# Patient Record
Sex: Male | Born: 1960 | Smoking: Current every day smoker
Health system: Southern US, Community
[De-identification: ages and names within clinical notes are randomized; demographics above are authoritative.]

## PROBLEM LIST (undated history)

## (undated) DIAGNOSIS — M179 Osteoarthritis of knee, unspecified: Secondary | ICD-10-CM

## (undated) DIAGNOSIS — F419 Anxiety disorder, unspecified: Secondary | ICD-10-CM

## (undated) DIAGNOSIS — B1920 Unspecified viral hepatitis C without hepatic coma: Secondary | ICD-10-CM

## (undated) DIAGNOSIS — I2510 Atherosclerotic heart disease of native coronary artery without angina pectoris: Secondary | ICD-10-CM

## (undated) DIAGNOSIS — R0689 Other abnormalities of breathing: Secondary | ICD-10-CM

## (undated) DIAGNOSIS — N189 Chronic kidney disease, unspecified: Secondary | ICD-10-CM

## (undated) DIAGNOSIS — F1210 Cannabis abuse, uncomplicated: Secondary | ICD-10-CM

## (undated) DIAGNOSIS — E669 Obesity, unspecified: Secondary | ICD-10-CM

## (undated) DIAGNOSIS — E785 Hyperlipidemia, unspecified: Secondary | ICD-10-CM

## (undated) DIAGNOSIS — R0600 Dyspnea, unspecified: Secondary | ICD-10-CM

## (undated) DIAGNOSIS — M109 Gout, unspecified: Secondary | ICD-10-CM

## (undated) DIAGNOSIS — K219 Gastro-esophageal reflux disease without esophagitis: Secondary | ICD-10-CM

## (undated) DIAGNOSIS — M169 Osteoarthritis of hip, unspecified: Secondary | ICD-10-CM

## (undated) DIAGNOSIS — M1710 Unilateral primary osteoarthritis, unspecified knee: Secondary | ICD-10-CM

## (undated) DIAGNOSIS — F1410 Cocaine abuse, uncomplicated: Secondary | ICD-10-CM

## (undated) DIAGNOSIS — I10 Essential (primary) hypertension: Secondary | ICD-10-CM

## (undated) HISTORY — DX: Gastro-esophageal reflux disease without esophagitis: K21.9

## (undated) HISTORY — DX: Dyspnea, unspecified: R06.89

## (undated) HISTORY — DX: Gout, unspecified: M10.9

## (undated) HISTORY — DX: Unspecified viral hepatitis C without hepatic coma: B19.20

## (undated) HISTORY — DX: Chronic kidney disease, unspecified: N18.9

## (undated) HISTORY — DX: Cannabis abuse, uncomplicated: F12.10

## (undated) HISTORY — DX: Anxiety disorder, unspecified: F41.9

## (undated) HISTORY — DX: Osteoarthritis of hip, unspecified: M16.9

## (undated) HISTORY — DX: Unilateral primary osteoarthritis, unspecified knee: M17.10

## (undated) HISTORY — DX: Cocaine abuse, uncomplicated: F14.10

## (undated) HISTORY — DX: Osteoarthritis of knee, unspecified: M17.9

## (undated) HISTORY — DX: Obesity, unspecified: E66.9

## (undated) HISTORY — PX: KNEE SURGERY: SHX244

## (undated) HISTORY — DX: Dyspnea, unspecified: R06.00

## (undated) HISTORY — DX: Hyperlipidemia, unspecified: E78.5

## (undated) HISTORY — DX: Essential (primary) hypertension: I10

## (undated) HISTORY — DX: Atherosclerotic heart disease of native coronary artery without angina pectoris: I25.10

---

## 2008-03-01 HISTORY — PX: CARDIAC CATHETERIZATION: SHX172

## 2008-03-01 HISTORY — PX: CORONARY ANGIOPLASTY: SHX604

## 2011-11-26 ENCOUNTER — Emergency Department: Payer: Self-pay | Admitting: Emergency Medicine

## 2011-12-07 ENCOUNTER — Inpatient Hospital Stay: Payer: Self-pay | Admitting: Internal Medicine

## 2011-12-07 LAB — DRUG SCREEN, URINE
Cannabinoid 50 Ng, Ur ~~LOC~~: NEGATIVE (ref ?–50)
Cocaine Metabolite,Ur ~~LOC~~: POSITIVE (ref ?–300)
MDMA (Ecstasy)Ur Screen: NEGATIVE (ref ?–500)
Opiate, Ur Screen: POSITIVE (ref ?–300)
Phencyclidine (PCP) Ur S: NEGATIVE (ref ?–25)

## 2011-12-07 LAB — SALICYLATE LEVEL: Salicylates, Serum: 2.8 mg/dL

## 2011-12-07 LAB — TROPONIN I
Troponin-I: 0.38 ng/mL — ABNORMAL HIGH
Troponin-I: 0.39 ng/mL — ABNORMAL HIGH

## 2011-12-07 LAB — COMPREHENSIVE METABOLIC PANEL
Albumin: 3.9 g/dL (ref 3.4–5.0)
Alkaline Phosphatase: 101 U/L (ref 50–136)
Calcium, Total: 8.5 mg/dL (ref 8.5–10.1)
Co2: 26 mmol/L (ref 21–32)
EGFR (Non-African Amer.): 45 — ABNORMAL LOW
Glucose: 134 mg/dL — ABNORMAL HIGH (ref 65–99)
SGOT(AST): 27 U/L (ref 15–37)
SGPT (ALT): 29 U/L (ref 12–78)
Sodium: 139 mmol/L (ref 136–145)

## 2011-12-07 LAB — CBC
MCV: 92 fL (ref 80–100)
Platelet: 175 10*3/uL (ref 150–440)
RBC: 4.56 10*6/uL (ref 4.40–5.90)
RDW: 15.1 % — ABNORMAL HIGH (ref 11.5–14.5)
WBC: 9.3 10*3/uL (ref 3.8–10.6)

## 2011-12-07 LAB — ACETAMINOPHEN LEVEL: Acetaminophen: <2 ug/mL

## 2011-12-07 LAB — APTT: Activated PTT: 30.5 secs (ref 23.6–35.9)

## 2011-12-07 LAB — CK TOTAL AND CKMB (NOT AT ARMC)
CK, Total: 175 U/L (ref 35–232)
CK-MB: 3.2 ng/mL (ref 0.5–3.6)
CK-MB: 2.7 ng/mL (ref 0.5–3.6)

## 2011-12-08 ENCOUNTER — Encounter: Payer: Self-pay | Admitting: *Deleted

## 2011-12-08 DIAGNOSIS — R748 Abnormal levels of other serum enzymes: Secondary | ICD-10-CM

## 2011-12-08 DIAGNOSIS — R079 Chest pain, unspecified: Secondary | ICD-10-CM

## 2011-12-08 LAB — BASIC METABOLIC PANEL
Anion Gap: 11 (ref 7–16)
BUN: 23 mg/dL — ABNORMAL HIGH (ref 7–18)
Chloride: 105 mmol/L (ref 98–107)
Co2: 25 mmol/L (ref 21–32)
Creatinine: 1.57 mg/dL — ABNORMAL HIGH (ref 0.60–1.30)
EGFR (African American): 59 — ABNORMAL LOW
EGFR (Non-African Amer.): 51 — ABNORMAL LOW
Glucose: 115 mg/dL — ABNORMAL HIGH (ref 65–99)
Sodium: 141 mmol/L (ref 136–145)

## 2011-12-08 LAB — TROPONIN I: Troponin-I: 0.33 ng/mL — ABNORMAL HIGH

## 2011-12-08 LAB — CBC WITH DIFFERENTIAL/PLATELET
Basophil #: 0.1 10*3/uL (ref 0.0–0.1)
Eosinophil #: 0.3 10*3/uL (ref 0.0–0.7)
HGB: 13.5 g/dL (ref 13.0–18.0)
Lymphocyte #: 2 10*3/uL (ref 1.0–3.6)
MCHC: 35.4 g/dL (ref 32.0–36.0)
Monocyte #: 0.7 x10 3/mm (ref 0.2–1.0)
Neutrophil #: 4 10*3/uL (ref 1.4–6.5)
Neutrophil %: 56.3 %
RBC: 4.18 10*6/uL — ABNORMAL LOW (ref 4.40–5.90)

## 2011-12-08 LAB — APTT: Activated PTT: 36.1 secs — ABNORMAL HIGH (ref 23.6–35.9)

## 2011-12-08 LAB — LIPID PANEL
Cholesterol: 192 mg/dL (ref 0–200)
HDL Cholesterol: 22 mg/dL — ABNORMAL LOW (ref 40–60)
Ldl Cholesterol, Calc: 96 mg/dL (ref 0–100)
Triglycerides: 371 mg/dL — ABNORMAL HIGH (ref 0–200)
VLDL Cholesterol, Calc: 74 mg/dL — ABNORMAL HIGH (ref 5–40)

## 2011-12-08 LAB — CK TOTAL AND CKMB (NOT AT ARMC): CK-MB: 1.8 ng/mL (ref 0.5–3.6)

## 2011-12-08 LAB — HEMOGLOBIN A1C: Hemoglobin A1C: 5.6 % (ref 4.2–6.3)

## 2011-12-13 ENCOUNTER — Other Ambulatory Visit: Payer: Self-pay

## 2011-12-13 ENCOUNTER — Ambulatory Visit: Payer: Self-pay | Admitting: Cardiovascular Disease

## 2011-12-13 DIAGNOSIS — R079 Chest pain, unspecified: Secondary | ICD-10-CM

## 2011-12-14 ENCOUNTER — Encounter: Payer: Self-pay | Admitting: Cardiovascular Disease

## 2011-12-15 ENCOUNTER — Observation Stay: Payer: Self-pay | Admitting: Student

## 2011-12-15 LAB — COMPREHENSIVE METABOLIC PANEL
Alkaline Phosphatase: 95 U/L (ref 50–136)
Bilirubin,Total: 0.6 mg/dL (ref 0.2–1.0)
Calcium, Total: 8.5 mg/dL (ref 8.5–10.1)
Chloride: 100 mmol/L (ref 98–107)
Co2: 29 mmol/L (ref 21–32)
Creatinine: 2.38 mg/dL — ABNORMAL HIGH (ref 0.60–1.30)
EGFR (African American): 35 — ABNORMAL LOW
EGFR (Non-African Amer.): 31 — ABNORMAL LOW
Osmolality: 284 (ref 275–301)
SGPT (ALT): 49 U/L (ref 12–78)
Sodium: 139 mmol/L (ref 136–145)

## 2011-12-15 LAB — DRUG SCREEN, URINE
Barbiturates, Ur Screen: NEGATIVE (ref ?–200)
Benzodiazepine, Ur Scrn: NEGATIVE (ref ?–200)
Cannabinoid 50 Ng, Ur ~~LOC~~: NEGATIVE (ref ?–50)
Cocaine Metabolite,Ur ~~LOC~~: NEGATIVE (ref ?–300)
Phencyclidine (PCP) Ur S: NEGATIVE (ref ?–25)
Tricyclic, Ur Screen: NEGATIVE (ref ?–1000)

## 2011-12-15 LAB — CBC
HCT: 40.8 % (ref 40.0–52.0)
HGB: 14.6 g/dL (ref 13.0–18.0)
MCH: 33 pg (ref 26.0–34.0)
MCV: 92 fL (ref 80–100)
RDW: 15.2 % — ABNORMAL HIGH (ref 11.5–14.5)
WBC: 8.8 10*3/uL (ref 3.8–10.6)

## 2011-12-15 LAB — TSH: Thyroid Stimulating Horm: 1.16 u[IU]/mL

## 2011-12-16 LAB — BASIC METABOLIC PANEL
BUN: 30 mg/dL — ABNORMAL HIGH (ref 7–18)
Chloride: 102 mmol/L (ref 98–107)
Co2: 29 mmol/L (ref 21–32)
Creatinine: 2.15 mg/dL — ABNORMAL HIGH (ref 0.60–1.30)
EGFR (African American): 40 — ABNORMAL LOW
EGFR (Non-African Amer.): 35 — ABNORMAL LOW
Sodium: 140 mmol/L (ref 136–145)

## 2011-12-16 LAB — TROPONIN I: Troponin-I: 0.24 ng/mL — ABNORMAL HIGH

## 2011-12-16 LAB — CBC WITH DIFFERENTIAL/PLATELET
Basophil #: 0.1 10*3/uL (ref 0.0–0.1)
Eosinophil #: 0.3 10*3/uL (ref 0.0–0.7)
Eosinophil %: 4.8 %
HCT: 38.3 % — ABNORMAL LOW (ref 40.0–52.0)
Lymphocyte #: 1.8 10*3/uL (ref 1.0–3.6)
MCH: 31.8 pg (ref 26.0–34.0)
MCHC: 34.4 g/dL (ref 32.0–36.0)
MCV: 92 fL (ref 80–100)
Monocyte #: 0.6 x10 3/mm (ref 0.2–1.0)
Monocyte %: 9 %
Neutrophil %: 58.6 %
Platelet: 161 10*3/uL (ref 150–440)
RBC: 4.15 10*6/uL — ABNORMAL LOW (ref 4.40–5.90)
RDW: 15 % — ABNORMAL HIGH (ref 11.5–14.5)

## 2011-12-16 LAB — CK TOTAL AND CKMB (NOT AT ARMC)
CK, Total: 148 U/L (ref 35–232)
CK-MB: 1.1 ng/mL (ref 0.5–3.6)
CK-MB: 0.6 ng/mL (ref 0.5–3.6)

## 2011-12-17 ENCOUNTER — Telehealth: Payer: Self-pay

## 2011-12-17 DIAGNOSIS — I2510 Atherosclerotic heart disease of native coronary artery without angina pectoris: Secondary | ICD-10-CM

## 2011-12-17 DIAGNOSIS — R079 Chest pain, unspecified: Secondary | ICD-10-CM

## 2011-12-17 LAB — BASIC METABOLIC PANEL
Anion Gap: 9 (ref 7–16)
BUN: 26 mg/dL — ABNORMAL HIGH (ref 7–18)
Chloride: 105 mmol/L (ref 98–107)
Co2: 27 mmol/L (ref 21–32)
Creatinine: 1.67 mg/dL — ABNORMAL HIGH (ref 0.60–1.30)
Glucose: 82 mg/dL (ref 65–99)
Osmolality: 285 (ref 275–301)
Potassium: 4.4 mmol/L (ref 3.5–5.1)
Sodium: 141 mmol/L (ref 136–145)

## 2011-12-17 NOTE — Telephone Encounter (Signed)
Pt still in hospital. Will call pt 12/20/11

## 2011-12-17 NOTE — Telephone Encounter (Signed)
tcm pt 

## 2011-12-20 NOTE — Telephone Encounter (Signed)
TCM call I spoke with pt who was d/c from hosp over w/e with c/o CP, ARF, CHF Pt denies further CP since d/c He admits to some edema and sob but says this is unchanged from hosp. Confirms he has all meds he needs/was prescribed at d/c He mentions not having RX for his pain med (oxycontin 10 mg) and Diazepam. He says he does not have a PCP He also mentions the fact that he now lives in [place] and would like a referral to a cardiologist in [place]. I told him I would ask Dr. Mariah Milling and call him back.  I made him aware of appt with Dr. Mariah Milling for hosp f/u scheduled for 10/31 Pt asks that I move this to 1st week in November d/t scheduling conflicts This was r/s for 11/5. Pt confirms appt I explained he would need BMP prior to appt but he wants to have this checked same day as appt 01/04/12  I told him I would make Dr. Mariah Milling aware of pt's continued sob and edema as well as the need for referral to [facility], [place] cardiologist. I will also ask Dr. Mariah Milling about pain med and diazepam. Understanding verb.

## 2011-12-21 NOTE — Telephone Encounter (Signed)
Please see below and advise. thanks 

## 2011-12-22 NOTE — Telephone Encounter (Signed)
We do not do pain pills or benzodiazepines They should come from his primary care physician Do not know of any cardiologist in the [facility] area He can probably look on the Internet, try [facility]

## 2011-12-23 NOTE — Telephone Encounter (Signed)
Pt informed Understanding verb 

## 2011-12-27 ENCOUNTER — Encounter: Payer: Self-pay | Admitting: Cardiovascular Disease

## 2011-12-29 ENCOUNTER — Encounter: Payer: Medicaid Other | Admitting: Cardiovascular Disease

## 2012-01-04 ENCOUNTER — Encounter: Payer: Medicaid Other | Admitting: Cardiovascular Disease

## 2012-01-04 ENCOUNTER — Ambulatory Visit: Payer: Medicaid Other | Admitting: Cardiovascular Disease

## 2012-01-21 ENCOUNTER — Other Ambulatory Visit: Payer: Self-pay | Admitting: Cardiovascular Disease

## 2012-01-21 DIAGNOSIS — R079 Chest pain, unspecified: Secondary | ICD-10-CM

## 2013-07-26 IMAGING — CR DG CHEST 1V PORT
1 series · 1 of 1 positions shown · non-contrast
Comparison: none

REASON FOR EXAM: cp
COMMENTS:

[ap]
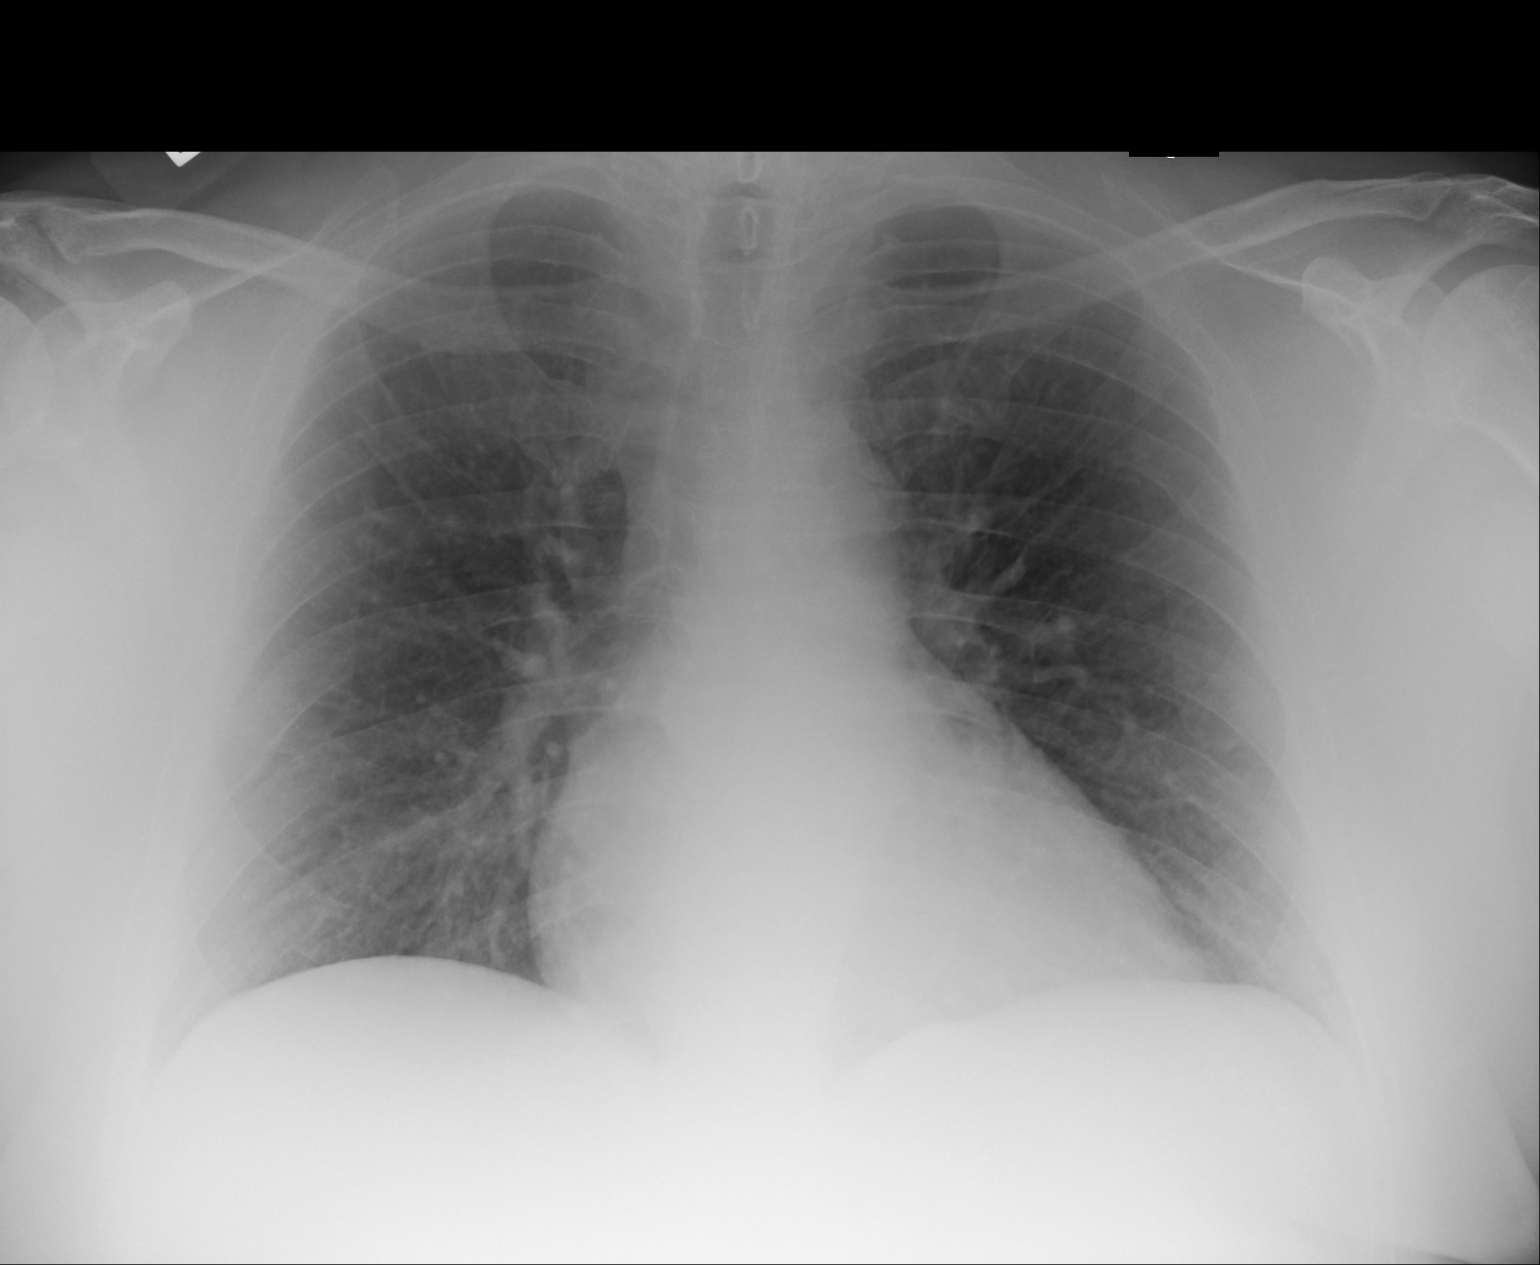

[1 of 1 positions shown; findings below may reference images not displayed]

PROCEDURE:     DXR - DXR PORTABLE CHEST SINGLE VIEW  - December 15, 2011  [DATE]

RESULT:     The lungs are adequately inflated. There is no focal infiltrate.
The cardiac silhouette is top normal in size. The pulmonary vascularity is
not engorged. There is mild tortuosity of the descending thoracic aorta.
There is no pleural effusion.
IMPRESSION: There is no evidence of acute cardiopulmonary abnormality.
A followup PA and lateral chest x-ray may be useful if the patient's
symptoms persist and remain unexplained.

[REDACTED]

## 2014-06-18 NOTE — Consult Note (Signed)
General Aspect 54 year old gentleman with a history of hypertension, coronary artery disease, stent in 2005 and 2008 at [facility], chronic kidney disease, gout, prior and recent drug use,  presented to the ED with generalized body pain and chest pain.Cardiology was consulted for chest pain and elevated cardiac enz.    he  had chest pain that developed while walking, moderately intense. Radiation to shoulder, neck. He has been on muscle relaxers adn pain meds by PMD is raleigth for neck and head tightness.   He denies any nausea, vomiting, no diaphoresis, palpitation, orthopnea, or nocturnal dyspnea. He complains of headache but no dizziness, no abdominal pain, nausea, vomiting, or diarrhea.   Last used cocaine two days ago, smoked. Last used 5 to 6 months ago. "I didn't think a small amount would hurt me"   PAST MEDICAL HISTORY:  1. Hypertension.  2. Coronary artery disease.  3. Chronic kidney disease. 4. Gout.   PAST SURGICAL HISTORY:  Cardiac stent twice,  SOCIAL HISTORY: The patient does smoke, 10 cigarettes a day for more than 30 years. He denies any drug abuse or alcohol abuse; but after informing the patient he was positive for cocaine in the urine, the patient admitted that he used cocaine 3 to 4 days ago.   FAMILY HISTORY: Parents had hypertension. Father had a heart attack. Mother had diabetes.   Physical Exam:   GEN well developed, well nourished, no acute distress    HEENT red conjunctivae    NECK supple  No masses    RESP normal resp effort  clear BS    CARD Regular rate and rhythm  No murmur    ABD denies tenderness  soft    EXTR negative edema    SKIN normal to palpation    NEURO motor/sensory function intact    PSYCH alert, A+O to time, place, person, good insight   Review of Systems:   Subjective/Chief Complaint chest pain, neck and shoulder pain    General: No Complaints    Skin: No Complaints    ENT: No Complaints    Eyes: No Complaints    Neck:  No Complaints    Respiratory: No Complaints    Cardiovascular: Chest pain or discomfort    Gastrointestinal: No Complaints    Genitourinary: No Complaints    Vascular: No Complaints    Musculoskeletal: Muscle or joint pain  Muscle or Joint Stiffness    Neurologic: No Complaints    Hematologic: No Complaints    Endocrine: No Complaints    Psychiatric: No Complaints    Review of Systems: All other systems were reviewed and found to be negative    Medications/Allergies Reviewed Medications/Allergies reviewed     Gout:    HTN:    Kidney Failure:    CAD:    Myocardial Infarct:    Hypoglycemia:    Stent - Cardiac:        Admit Reason:   Chest pain: (786.50) Active, ICD9, Unspecified chest pain  Home Medications: Medication Instructions Status  quinapril 40 mg oral tablet 1 tab(s) orally once a day Active  omega-3 polyunsaturated fatty acids 500 mg oral capsule 4 cap(s) orally 2 times a day (with meals) Active  omeprazole 40 mg oral delayed release capsule 1 cap(s) orally once a day Active  furosemide 20 mg oral tablet 1 tab(s) orally 2 times a day Active  spironolactone 25 mg oral tablet 1 tab(s) orally once a day Active  hydrALAZINE 25 mg oral tablet 1 tab(s)  orally 2 times a day Active  Nitrostat 0.4 mg sublingual tablet 1 tab(s) sublingual every 5 minutes, As Needed- for Chest Pain  (max 3 doses per day) Active  ProAir HFA 90 mcg/inh inhalation aerosol 2 puff(s) inhaled 4 times a day for 30 days Active  atorvastatin 40 mg oral tablet 2 tab(s) orally once a day Active  carvedilol 25 mg oral tablet 1 tab(s) orally 2 times a day Active  felodipine 10 mg oral tablet, extended release 1 tab(s) orally once a day Active  tamsulosin 0.4 mg oral capsule 1 cap(s) orally once a day Active   Lab Results: Routine Chem:  09-Oct-13 02:33    Cholesterol, Serum 192   Triglycerides, Serum  371   HDL (INHOUSE)  22   VLDL Cholesterol Calculated  74   LDL Cholesterol  Calculated 96 (Result(s) reported on 08 Dec 2011 at 03:00AM.)   Glucose, Serum  115   BUN  23   Creatinine (comp)  1.57   Sodium, Serum 141   Potassium, Serum 3.8   Chloride, Serum 105   CO2, Serum 25   Calcium (Total), Serum  8.3   Anion Gap 11   Osmolality (calc) 286   eGFR (African American)  59   eGFR (Non-African American)  51 (eGFR values <18m/min/1.73 m2 may be an indication of chronic kidney disease (CKD). Calculated eGFR is useful in patients with stable renal function. The eGFR calculation will not be reliable in acutely ill patients when serum creatinine is changing rapidly. It is not useful in  patients on dialysis. The eGFR calculation may not be applicable to patients at the low and high extremes of body sizes, pregnant women, and vegetarians.)   Hemoglobin A1c ([facility]) 5.6 (The American Diabetes Association recommends that a primary goal of therapy should be <7% and that physicians should reevaluate the treatment regimen in patients with HbA1c values consistently >8%.)  Routine Coag:  09-Oct-13 02:33    Activated PTT (APTT)  58.0 (A HCT value >55% may artifactually increase the APTT. In one study, the increase was an average of 19%. Reference: "Effect on Routine and Special Coagulation Testing Values of Citrate Anticoagulant Adjustment in Patients with High HCT Values." American Journal of Clinical Pathology 2006;126:400-405.)  Routine Hem:  09-Oct-13 02:33    WBC (CBC) 7.1   RBC (CBC)  4.18   Hemoglobin (CBC) 13.5   Hematocrit (CBC)  38.1   Platelet Count (CBC) 172   MCV 91   MCH 32.3   MCHC 35.4   RDW  14.6   Neutrophil % 56.3   Lymphocyte % 28.1   Monocyte % 10.2   Eosinophil % 4.3   Basophil % 1.1   Neutrophil # 4.0   Lymphocyte # 2.0   Monocyte # 0.7   Eosinophil # 0.3   Basophil # 0.1 (Result(s) reported on 08 Dec 2011 at 02:48AM.)   EKG:   Interpretation EKG shows NSR with RBBB, lateral lead T wave inversion I and AVL    Ketorolac:  Hives  Benadryl: Hives  Other -Explain in Comment Field: Hives  Vital Signs/Nurse's Notes: **Vital Signs.:   09-Oct-13 07:40   Vital Signs Type Routine   Temperature Temperature (F) 98.4   Celsius 36.8   Temperature Source Oral   Pulse Pulse 62   Respirations Respirations 18   Systolic BP Systolic BP 1235  Diastolic BP (mmHg) Diastolic BP (mmHg) 72   Mean BP 85   Pulse Ox % Pulse Ox % 95  Pulse Ox Activity Level  At rest   Oxygen Delivery Room Air/ 21 %     Impression 54 year old gentleman with a history of hypertension, coronary artery disease, stent in 2005 and 2008 at [facility], chronic kidney disease, gout, prior and recent drug use,  presented to the ED with generalized body pain and chest pain.Cardiology was consulted for chest pain and elevated cardiac enz.   1) Elevated cardiac enz: Stable with no change minor troponin leak, possibly from cocaine/HTN -Routine treadmill study planned this AM  2)Chest pain Some atypical features though given previous chest pain hx,  will do routine treadmill today He did not want to stay another day for myoview. He has eaten this AM and had caffeine  3)cocaine discussed with him the risks of drug use. "I thoughht a small amount woul/d not hurt me" This could contributing to sx  4)CAD, hx of PCI continue outpt meds Hold coreg today for treadmill and recent cocaine   Electronic Signatures: Ida Rogue (MD)  (Signed 09-Oct-13 09:06)  Authored: General Aspect/Present Illness, History and Physical Exam, Review of System, Past Medical History, Health Issues, Home Medications, Labs, EKG , Allergies, Vital Signs/Nurse's Notes, Impression/Plan   Last Updated: 09-Oct-13 09:06 by Ida Rogue (MD)

## 2014-06-18 NOTE — Consult Note (Signed)
General Aspect 54 year old [place] American male with past medical history significant for history of coronary artery disease, status post stent placements in the past, history of cardiac catheterization in 2005 as well as 2007, also stress test approximately two years ago presented back to the hospital just being discharged on 12/08/2011 for chest pains. Cardiology was consulted for chest pain.  He had left-sided chest pain, sharp and constant, feeling as if someone is sitting on the chest accompanied by shortness of breath and radiating to left neck as well as numbing left arm, left fingers.  pain is 7/10 by intensity.   In the  Emergency Room patient's troponin was noted to be  0.26. It was also noted to be elevated during his past admission on the 8th to 9th of October 2013. He was scheduled to have a lexiscan myoview as an outpt and was a "no show" for the test.    PAST MEDICAL HISTORY:  1. History of recent admission for chest pains, unable to get through stress testing because of feeling tired. LexiScan Myoview was suggested, however, never completed.  2. History of hypertension. 3. Coronary artery disease, status post stenting in the past. 4. Chronic renal disease. 5. Gout.   PAST SURGICAL HISTORY: Cardiac stent x2. No other surgeries.   SOCIAL HISTORY:  smokes 7 to 8 cigarettes a day for more than 30 years.cocaine in his urine and he admitted using cocaine, ast admission   Physical Exam:   GEN well developed, well nourished, no acute distress    HEENT moist oral mucosa    NECK supple  No masses    RESP normal resp effort  clear BS    CARD Regular rate and rhythm  No murmur    ABD denies tenderness  soft    LYMPH negative neck    EXTR negative edema    SKIN normal to palpation    NEURO motor/sensory function intact    PSYCH alert, A+O to time, place, person   Review of Systems:   Subjective/Chief Complaint chest pain    General: No Complaints    Skin: No  Complaints    ENT: No Complaints    Eyes: No Complaints    Neck: No Complaints    Respiratory: No Complaints    Cardiovascular: Chest pain or discomfort    Genitourinary: No Complaints    Vascular: No Complaints    Musculoskeletal: No Complaints    Neurologic: No Complaints    Hematologic: No Complaints    Endocrine: No Complaints    Psychiatric: No Complaints    Review of Systems: All other systems were reviewed and found to be negative    Medications/Allergies Reviewed Medications/Allergies reviewed     CHF:    Gout:    HTN:    Kidney Failure:    CAD:    Myocardial Infarct:    Hypoglycemia:    Stent - Cardiac:        Admit Reason:   Chest pain: (786.50) Active, ICD9, Unspecified chest pain  Lab Results: Routine Chem:  17-Oct-13 00:19    Glucose, Serum  139   BUN  30   Creatinine (comp)  2.15   Sodium, Serum 140   Potassium, Serum 4.4   Chloride, Serum 102   CO2, Serum 29   Calcium (Total), Serum 8.5   Anion Gap 9   Osmolality (calc) 288   eGFR (African American)  40   eGFR (Non-African American)  35 (eGFR values <61m/min/1.73 m2 may  be an indication of chronic kidney disease (CKD). Calculated eGFR is useful in patients with stable renal function. The eGFR calculation will not be reliable in acutely ill patients when serum creatinine is changing rapidly. It is not useful in  patients on dialysis. The eGFR calculation may not be applicable to patients at the low and high extremes of body sizes, pregnant women, and vegetarians.)   Result Comment TROPONIN - RESULTS VERIFIED BY REPEAT TESTING.  - PREVIOUS CALL:12/15/11 @ 1725.Marland KitchenMarland KitchenTPL  Result(s) reported on 16 Dec 2011 at 12:57AM.  Cardiac:  17-Oct-13 00:19    CK, Total 227   CPK-MB, Serum 1.4 (Result(s) reported on 16 Dec 2011 at 12:57AM.)   Troponin I  0.24 (0.00-0.05 0.05 ng/mL or less: NEGATIVE  Repeat testing in 3-6 hrs  if clinically indicated. >0.05 ng/mL: POTENTIAL  MYOCARDIAL  INJURY. Repeat  testing in 3-6 hrs if  clinically indicated. NOTE: An increase or decrease  of 30% or more on serial  testing suggests a  clinically important change)  Routine Hem:  17-Oct-13 00:19    WBC (CBC) 6.8   RBC (CBC)  4.15   Hemoglobin (CBC) 13.2   Hematocrit (CBC)  38.3   Platelet Count (CBC) 161   MCV 92   MCH 31.8   MCHC 34.4   RDW  15.0   Neutrophil % 58.6   Lymphocyte % 26.7   Monocyte % 9.0   Eosinophil % 4.8   Basophil % 0.9   Neutrophil # 4.0   Lymphocyte # 1.8   Monocyte # 0.6   Eosinophil # 0.3   Basophil # 0.1 (Result(s) reported on 16 Dec 2011 at 12:42AM.)   EKG:   Interpretation EKG shows NSR with T wave ABN V5, V6, I and AVL    Ketorolac: Hives  Benadryl: Hives  Other -Explain in Comment Field: Hives  Vital Signs/Nurse's Notes: **Vital Signs.:   18-Oct-13 07:46   Vital Signs Type Routine   Temperature Temperature (F) 98.1   Celsius 36.7   Temperature Source Oral   Pulse Pulse 56   Respirations Respirations 20   Systolic BP Systolic BP 916   Diastolic BP (mmHg) Diastolic BP (mmHg) 77   Mean BP 89   Pulse Ox % Pulse Ox % 94   Pulse Ox Activity Level  At rest   Oxygen Delivery Room Air/ 21 %     Impression 54 year old [place] American male with past medical history significant for history of coronary artery disease, status post stent placements in the past, history of cardiac catheterization in 2005 as well as 2007, also stress test approximately two years ago presented back to the hospital just being discharged on 12/08/2011 for chest pains. Cardiology was consulted for chest pain.  1) Chest pain: Cardiac enz stable, likely from [place] test this AM as he had suboptimal tress treadmil on his ast vist, was a "no show" for outpt myoview If stress test is negative, consider other causes of chest pain  2)Renal insufficiency: Likely component of underlying renal dysfunction, with dehydration Hold lasix, ACE  3) HTN: Hold  ACE, lasix: continue other meds for BP  4) Smoking: counseled on smoking cessation "I get the cigs for free, can't afford e-cig".   Electronic Signatures: Ida Rogue (MD)  (Signed 18-Oct-13 09:43)  Authored: General Aspect/Present Illness, History and Physical Exam, Review of System, Past Medical History, Health Issues, Labs, EKG , Allergies, Vital Signs/Nurse's Notes, Impression/Plan   Last Updated: 18-Oct-13 09:43 by Ida Rogue (MD)

## 2014-06-18 NOTE — Discharge Summary (Signed)
PATIENT NAME:  Lonnie Taylor, Lonnie Taylor MR#:  [id number] DATE OF BIRTH:  October 29, 1960  DATE OF ADMISSION:  12/15/2011 DATE OF DISCHARGE:  12/17/2011  CONSULTANT: Dr. Mariah Milling from Cardiology    CHIEF COMPLAINT: Chest pain.   DISCHARGE DIAGNOSES:  1. Chest pain, likely not cardiac.  2. Acute on chronic renal failure.  3. Chronic systolic congestive heart failure.  4. Positive troponin, likely demand ischemia.  5. History of hypertension.  6. History of coronary artery disease status post stent in the past.  7. Chronic renal failure.  8. History of gout.   DISCHARGE MEDICATIONS:  1. Tamsulosin 0.4 mg daily.  2. Nitrostat 0.4 mg sublingual every five minutes x3 p.r.n. for chest pain.  3. Lipitor 80 mg daily.  4. Coreg 25 mg 2 times a day.  5. ProAir HFA 90 mcg 2 puffs 4 times a day as needed for shortness of breath.  6. Felodipine 10 mg once a day.  7. Omega-3 2 grams 2 times a day.  8. Omeprazole 40 mg daily.  9. Hydralazine 25 mg 2 times a day.  10. Diazepam 10 mg 3 times a day.  11. Prozac 40 mg daily.  12. Aspirin 81 mg daily.  13. Tylenol 650 mg every four hours as needed for pain or fever.   DO NOT TAKE:  1. Spironolactone. 2. Quinapril. 3. Lasix.  DIET: Low sodium.   ACTIVITY: As tolerated.   FOLLOW-UP: Please follow with your primary care physician for a BMP check within 1 to 2 weeks and, if stable kidney function, can resume Lasix and quinapril if okayed by your physician.   DISPOSITION: Home.   CODE STATUS: FULL CODE.   HISTORY OF PRESENT ILLNESS AND BRIEF HOSPITAL COURSE: For full details of the history and physical, please see the dictation on October 16th by Dr. Katharina Caper. Briefly, this is a 54 year old obese male with history of coronary artery disease status post stenting in the past and cardiac cath in 2005 and 2007 per chart who came in and was discharged on 12/08/2011 for chest pain. At that time he did have elevated troponin and was seen by Cardiology. A  stress test was ordered, which was treadmill. The patient did not finish the treadmill as he said he was tired and could not go through with the full stress test. At that time he was discharged and recommendation was to follow-up with Cardiology as an outpatient for a Myoview stress test. That was a no show. On arrival he was noted to have mildly elevated troponin as well. At this time it was 0.26. Troponins were cycled and were 0.24, then 0.25. Cardiology was consulted. He underwent a Myoview on the 18th which did not show acute ischemia but did find previous scars from previous MI. At this point he is pain free. His chest pains are likely not cardiac in nature. EF was noted to be mildly depressed at 43% and overall this was seen as a low risk scan and no further work-up was recommended from Cardiology perspective.   He did have mild acute on chronic renal failure. He was previously on spironolactone, ACE, as well as Lasix. They were stopped and he was given gentle IV fluids. He does have CKD. On arrival his troponin was actually higher than baseline. Initial creatinine was 2.38 and by discharge had come down to 1.67. He is to follow-up with his primary care physician for further work-up, BMP check, and resumption of his ACE inhibitor as well Lasix if  the creatinine is stable.   TOTAL TIME SPENT: 35 minutes.    CODE STATUS: The patient is FULL CODE.  ____________________________ Krystal EatonShayiq Zair Borawski, MD sa:drc D: 12/17/2011 16:30:29 ET T: 12/19/2011 15:15:58 ET JOB#: [id number]  cc: Krystal EatonShayiq Louna Rothgeb, MD, <Dictator> Krystal EatonSHAYIQ Crystallynn Noorani MD ELECTRONICALLY SIGNED 12/31/2011 12:44

## 2014-06-18 NOTE — H&P (Signed)
PATIENT NAME:  Lonnie Taylor MR#:  [id number] DATE OF BIRTH:  02-01-1961  DATE OF ADMISSION:  12/07/2011  PRIMARY CARE PHYSICIAN: Nonlocal  REFERRING PHYSICIAN: Dorothea GlassmanPaul Malinda, MD   CHIEF COMPLAINT: Body pain, chest pain, coming for detox.   HISTORY OF PRESENT ILLNESS: The patient is Taylor 54 year old gentleman with Taylor history of hypertension, coronary artery disease, chronic kidney disease, gout, presented to the ED with generalized body pain and chest pain. He came here for detox. According to the patient, he also had chest pain which is mild, radiation to the left shoulder. The chest pain started today. He denies any nausea, vomiting, no diaphoresis, palpitation, orthopnea, or nocturnal dyspnea. He complains of headache but no dizziness, no abdominal pain, nausea, vomiting, or diarrhea. The patient said he had coronary artery disease, and  myocardial infarction twice in the past, and had two stents placed.   PAST MEDICAL HISTORY:  1. Hypertension.  2. Coronary artery disease.  3. Chronic kidney disease. 4. Gout.   PAST SURGICAL HISTORY: Cardiac stent twice, no other surgery.   SOCIAL HISTORY: The patient does smoke, 10 cigarettes Taylor day for more than 30 years. He denies any drug abuse or alcohol abuse; but after informing the patient he was positive for cocaine in the urine, the patient admitted that he used cocaine 3 to 4 days ago.   FAMILY HISTORY: Parents had hypertension. Father had Taylor heart attack. Mother had diabetes.    ALLERGIES: Benadryl, ketorolac.   HOME MEDICATIONS:   1. Atorvastatin 40 mg p.o., 2 tablets once Taylor day.  2. Coreg 25 mg p.o. b.i.d.  3. Felodipine 10 mg p.o. daily.  4. Lasix 20 mg p.o. b.i.d.  5. Hydralazine 25 mg p.o. b.i.d.  6. Nitrostat 0.4 mg sublingual every 5 minutes p.r.n. for chest pain.  7. Omega-3 500 mg full cap p.o. b.i.d.  8. Omeprazole 40 mg p.o. daily.  9. ProAir HFA 90 mcg inhalation, 2 puffs q.i.d.  10. Quinapril 40 mg p.o. daily.   11. Spironolactone 25 mg p.o. daily.  12. Flomax 0.4 mg p.o. daily    REVIEW OF SYSTEMS: CONSTITUTIONAL: The patient has Taylor headache but no dizziness, weakness. No weight loss. EYES: No double vision, blurred vision. ENT: No postnasal drip, slurred speech, dysphagia, or epistaxis. CARDIOVASCULAR: Positive for chest pain. No palpitations, orthopnea, or nocturnal dyspnea. No leg edema. PULMONARY: No cough, sputum, shortness of breath, or hemoptysis. GI: No abdominal pain, nausea, vomiting, or diarrhea. No melena or bloody stool. GU: No dysuria, hematuria, or incontinence. SKIN: No rash or jaundice. HEMATOLOGY: No easy bruising or bleeding. SKIN: No rash or jaundice. NEUROLOGIC: No syncope, loss of consciousness or seizure.   PHYSICAL EXAMINATION:  VITAL SIGNS: Temperature 97.6, blood pressure 197/127, pulse 74, oxygen saturation 96% on room air.   GENERAL: The patient is alert, awake, oriented, in no acute distress.   HEENT: Pupils are round, equal, and reactive to light and accommodation.   NECK: Supple. No JVD or carotid bruit. No lymphadenopathy. No thyromegaly. Moist oral mucosa. Clear oropharynx.   CARDIOVASCULAR: S1, S2 regular rate, rhythm. No murmurs or gallops.   PULMONARY: Bilateral air entry. No wheezing or rales. No use of accessory muscles to breathe.   ABDOMEN: Soft, obese. No distention or tenderness. No organomegaly. Bowel sounds are present.  EXTREMITIES: No edema, clubbing, or cyanosis. No calf tenderness. Strong bilateral pedal pulses.   SKIN: No rash or jaundice.   NEUROLOGICAL: Alert and oriented x3. No focal deficit.   LABORATORY,  DIAGNOSTIC AND RADIOLOGICAL DATA: Acetaminophen less than 2.0. Glucose 134, BUN 24, creatinine 1.74. Electrolytes are normal. Ethanol level less than 3.0. CBC normal. TSH 0.9. Urine toxicology showed opiate and cocaine positive. Troponin 0.38. EKG showed normal sinus rhythm at 64 beats per minute with right bundle branch block.   IMPRESSION:   1. Elevated troponin, possible non ST-elevation myocardial infarction. or cocaine induced.  2. Accelerated hypertension.  3. Cocaine abuse.  4. Coronary artery disease.  5. Chronic kidney disease.  6. Tobacco abuse.  7. Obesity.   PLAN OF TREATMENT:  1. The patient will be admitted to the telemetry floor. We will start heparin drip and continue aspirin, add Plavix, and continue atorvastatin and nitroglycerin p.r.n.  2. We will get Taylor Cardiology consult and get an echocardiogram.  3. We will follow up troponin level, lipid panel and hemoglobin A1c.  4. For accelerated hypertension, we will continue hypertension medication, give hydralazine IV p.r.n. but hold Coreg due to cocaine abuse.  5. GI prophylaxis.  6. Smoking cessation was counseled for five minutes.   I discussed the patient's situation and the plan of treatment with the patient.   TIME SPENT: 62 minutes.  ____________________________ Shaune Pollack, MD qc:cbb D: 12/07/2011 17:22:04 ET T: 12/07/2011 18:02:42 ET JOB#: [id number] Shaune Pollack MD ELECTRONICALLY SIGNED 12/08/2011 14:14

## 2014-06-18 NOTE — Discharge Summary (Signed)
PATIENT NAME:  Lonnie Taylor, Lonnie A MR#:  [id number] DATE OF BIRTH:  Dec 08, 1960  DATE OF ADMISSION:  12/07/2011 DATE OF DISCHARGE:  12/08/2011  FAMILY MEDICAL DOCTOR:  Not local.  HISTORY OF PRESENTATION: This is a [age] year old male presented with history of hypertension, coronary artery disease, chronic kidney disease and gout. He presented to the ER with generalized body pain, and chest pain. According to him he is a user of cocaine and heroin and he came to the Emergency Room for detox but having complaint of chest pain. Chest pain is mild, radiate to left shoulder and the pain started the same day when he presented to the ER he denied any nausea, vomiting, palpitation, orthopnea, or dyspnea. Denied any abdominal pain, nausea, vomiting. He had a history of coronary artery disease and myocardial infarction and had two stents placed in the past.   HOSPITAL STAY AND MANAGEMENT:  For his chest pain and having strong cardiac history, he was admitted to telemetry to rule out any acute cardiac event. His troponin was borderline high in the range of 0.3 so 3 sets  of troponin followed and they remained between 0.3 to 0.4 without rising and the patient remained chest pain free and uneventful on telemetry. Cardiology consult was done to decide any further work-up about his coronary artery disease and they did exercise stress test. The patient was not very cooperative with that he stopped walking after five minutes, and optimal heart rate was not achieved but as per the opinion of cardiologist he is very less likely having any cardiac event in view of having three troponins. Slightly elevated troponin was explained by his chronic renal failure and by cardiomyopathy which was also confirmed by echocardiogram and so cardiologist as she ordered that he is less likely to be having any coronary event and he can be discharged home safely with follow-up as [facility], which we advised the patient and he  agreed. We gave him all the contact numbers and care manager and the clerk on the floor arranged for it for the patient.   Other medical issues addressed during this hospital admission: Drug abuse. The patient as he was telling he is using heroin and cocaine but he was not having any signs of withdrawal during the hospital stay. I spoke to Dr. Maryruth BunKapur, psychiatrist, on phone, and she advised me to, as patient is not in any current withdrawal signs,  to arrange for at once access clinic appointment for further management and patient was already [place] [facility] so we arranged for this clinic and patient was given all the details about by the clerk and the case manager on the floor.  Chronic renal failure. This is a chronic issue as patient stated that [place]  [place] following with one of the nephrologist, [place] [facility] he would like to pick-up some of the nephrologist here and I gave him numbers of some of the nephrologist coming to this hospital. He will pick up one with the help of his primary medical doctor and will follow.  Chronic systolic and diastolic heart failure. The patient was currently not in any acute congestive failure symptoms so we managed him with his usual cardiac medications and discharged on the same.   DISCHARGE DIAGNOSES:  1. Noncardiac chest pain.  2. Substance abuse. 3. History of coronary artery disease. 4. Chronic congestive heart failure.   CONDITION ON DISCHARGE: Satisfactory.   CODE STATUS: FULL CODE.  IMPORTANT LAB RESULTS DURING THE HOSPITAL STAY: His creatinine on presentation was 1.74 which came down up to 1.57, which his baseline as per the patient. Potassium was slightly low at the range of 8.33. Three sets of troponin and cardiac enzymes were done. The troponins are as follows: 0.38, 0.39 and 0.33. Urine drug screening was done which was positive for cocaine and for opiate. Echocardiogram of the heart: Left  ventricular systolic function is mildly reduced, ejection fraction 45 to 50%. Moderate concentric left ventricular hypertrophy, mild global hypokinesis and left ventricle regional wall motion abnormality cannot be excluded due to limited visualization. The right ventricle and systolic function is normal. Left atrium is mildly dilated, right ventricle systolic pressure is normal.   For the medications on discharge, please see the discharge instructions in the chart. He was advised to return to work after follow up with his family medical doctor and he was advised to follow with [facility] clinic. He was given instructions by case manager and clerk about this. For follow-up in cardiology department for Lexiscan stress test. He was advised not to take carvedilol the day before he is planning to come for Lexiscan.  CODE STATUS: FULL CODE.      Total Time Spent in discharge 45 minutes    ____________________________ Lonnie Taylor. Elisabeth Pigeon, MD vgv:ljs D: 12/09/2011 14:52:37 ET T: 12/10/2011 11:58:40 ET JOB#: [id number]  cc: Lonnie Taylor. Elisabeth Pigeon, MD, <Dictator> Altamese Dilling MD ELECTRONICALLY SIGNED 12/21/2011 18:11

## 2014-06-18 NOTE — H&P (Signed)
PATIENT NAME:  Lonnie DoffingMCPHERSON, Lonnie A MR#:  [id number] DATE OF BIRTH:  01-07-1961  DATE OF ADMISSION:  12/15/2011  PRIMARY CARE PHYSICIAN: Dr. Suezanne JacquetShaner [facility] CARDIOLOGIST: Dr. Alphonzo SeveranceGary [facility]   HISTORY OF PRESENT ILLNESS: Patient is a 54 year old African American male with past medical history significant for history of coronary artery disease, status post stent placements in the past, history of cardiac catheterization in 2005 as well as 2007, also stress test approximately two years ago presented back to the hospital just being discharged on 12/08/2011 for chest pains. His pain is described as left-sided chest pain, sharp and constant, feeling as if someone is sitting on the chest accompanied by shortness of breath and radiating to left neck as well as numbing left arm, left fingers. Pain is still present during my evaluation. Patient tells me that his pain is 7/10 by intensity. On arrival to Emergency Room patient's troponin was noted to be mildly elevated to 0.26. It was also noted to be elevated during his past admission on the 8th to 9th of October 2013. Patient was admitted to the hospital those days. He was scheduled to have regular treadmill Myoview, however, was not able to get through Myoview because of feeling weak and tired and was recommended to have LexiScan Myoview with cardiologist, however, he never attended. Now he presents back with complaints of recurrence of his pain.   PAST MEDICAL HISTORY:  1. History of recent admission for chest pains, unable to get through stress testing because of feeling tired. LexiScan Myoview was suggested, however, never completed.  2. History of hypertension. 3. Coronary artery disease, status post stenting in the past. 4. Chronic renal disease. 5. Gout.   PAST SURGICAL HISTORY: Cardiac stent x2. No other surgeries.   SOCIAL HISTORY: Patient smokes approximately 7  to 8 cigarettes a day for more than 30 years. Denies any drug abuse or alcohol abuse, however, in the past he was noted to have also cocaine in his urine and he admitted using cocaine, however, his urine drug screen during this admission is negative.   FAMILY HISTORY: Significant for hypertension as well as heart attack in patient's father. Patient's mother had diabetes.   ALLERGIES: Benadryl as well as ketorolac according to medical records. Per medical records patient is also allergic to wool.   MEDICATION LIST:  1. Aspirin 81 mg p.o. daily.  2. Atorvastatin 40 mg 2 tablets once daily. 3. Carvedilol 25 mg p.o. twice daily.  4. Diazepam 10 mg 3 times daily. 5. Felodipine 10 mg p.o. daily. 6. Furosemide 20 mg p.o. twice daily. 7. Hydralazine 25 mg p.o. twice daily. 8. Nitrostat 0.4 mg sublingually every five minutes as needed.  9. Omega 3 polyunsaturated fatty acids 500 mg p.o. 4 capsule which will be 2 grams twice a day.  10. Omeprazole 40 mg p.o. daily.  11. ProAir HFA 2 puffs 4 times daily.  12. Prozac 40 mg p.o. daily.  13. Quinapril 40 mg p.o. daily.  14. Spironolactone 25 mg p.o. daily.  15. Tamsulosin 0.4 mg p.o. daily   REVIEW OF SYSTEMS: CONSTITUTIONAL: Positive for feeling cold earlier today as well as fatigued, pains in the chest, weight gain approximately 8 to 10 pounds in the past one month, some snoring in the past, cough with greenish phlegm for the past five to six years, intermittent wheezing especially whenever he lays down flat, chest pains earlier today, lower extremity swelling which seems to  be chronic, has been having problems with swelling for the past month in legs, ankles as well as his feet, feeling as if he has arrhythmias irregular heart beats as well as dyspnea on exertion earlier today with chest pains. Otherwise he also has some urge to urinate, however, has difficulty to initiate stream, also feels that he is retaining some urine in his bladder. Denies any  fevers, chills, weakness. weight loss. EYES: In regards to eyes, denies any blurry vision, double vision, glaucoma, cataracts. ENT: Denies any tinnitus, allergies, epistaxis, sinus pain, dentures, difficulty swallowing. RESPIRATORY: Denies any hemoptysis, asthma, chronic obstructive pulmonary disease. CARDIOVASCULAR: Denies any orthopnea, palpitations, syncope. GASTROINTESTINAL: Denies nausea, vomiting, diarrhea, constipation. GENITOURINARY: Denies dysuria, hematuria, frequency, incontinence. ENDOCRINOLOGY: Denies any polydipsia, nocturia, thyroid problems, heat or cold intolerance, or thirst. HEME: Denies any anemia, easy bruising, bleeding, swollen glands. SKIN: Denies any acne, rash, lesions, change in moles. MUSCULOSKELETAL: Denies arthritis, cramps, swelling. NEUROLOGICAL: No numbness, epilepsy, tremor. PSYCH: Denies anxiety, insomnia, depression.   PHYSICAL EXAMINATION:  VITAL SIGNS: On arrival to the hospital patient's vitals: Temperature 98.6, pulse 73, respiration rate 18, blood pressure 144/75, saturation 95% on room air.   GENERAL: This is a well-nourished, obese African American male in no significant distress sitting on the stretcher. He is somewhat a little diaphoretic and anxious.   HEENT: His pupils are equal, reactive to light. Extraocular movements intact. No icterus or conjunctivitis. Has normal hearing. No pharyngeal erythema. Mucosa is moist.   NECK: No masses, supple, nontender. Thyroid not enlarged. No adenopathy. No JVD or carotid bruits bilaterally. Full range of motion.   LUNGS: Clear to auscultation in all fields. Somewhat diminished breath sounds but no wheezing, no labored inspirations, increased effort, dullness to percussion, overt respiratory distress.   CARDIOVASCULAR: S1, S2 appreciated. No murmurs, gallops, rubs noted. PMI not lateralized. Chest is nontender to palpation. 1+ pedal pulses. Trace to 1+ lower extremity edema. Mild calf tenderness in right lower extremity,  right calf.   ABDOMEN: Protuberant, soft, nontender. Bowel sounds are present. No hepatosplenomegaly or masses were noted.   RECTAL: Deferred.   MUSCULOSKELETAL: Able to move all extremities. No cyanosis, degenerative joint disease or kyphosis. Gait is not tested.   SKIN: Skin did not reveal any rashes, lesions, erythema, nodularity, induration. It was warm and dry to palpation.   LYMPH: No adenopathy in cervical region.   NEUROLOGICAL: Cranial nerves grossly intact. Sensory is intact. No dysarthria, aphasia.   PSYCH: Patient is alert, oriented to time, person, and place, cooperative. Memory is good. No significant confusion, agitation, depression noted.   LABORATORY, DIAGNOSTIC AND RADIOLOGICAL DATA: EKG showed normal sinus rhythm, right bundle branch block at 70 beats per minute, normal axis, no acute ST-T changes were noted. Patient's EKG done in the recent past also showed right bundle branch block and no other significant abnormalities, however, high lateral T depressions were noted during prior as well as this EKG, possible lateral ischemia. BMP revealed BUN and creatinine of 30 and 2.38 as compared to 23 and 1.57 on 12/08/2011. Patient's liver enzymes showed AST slight elevation of 49, troponin is elevated at 0.26. Urine drug screen is negative. CBC: White blood cell count 8.8, hemoglobin 14.6, platelet count 189. Chest x-ray within normal limits.   ASSESSMENT AND PLAN:  1. Unstable angina. Admit patient to medical floor. Continue metoprolol, beta blocker as well as aspirin, nitroglycerin topically as well as heparin sub-Q. Will get Myoview stress test LexiScan ordered for the morning unless his troponin is  markedly elevated.  2. Hypertension. Add nitroglycerin topically.  3. Acute on chronic renal failure. Hold patient's diuretics and administer IV fluids overnight.  4. Suspected obstructive sleep apnea. Get nocturnal oximetry study. Would benefit from sleep study as outpatient.   5. Elevated transaminases. Follow in the morning.  6. History of hyperlipidemia and hypertriglyceridemia. Will get dietary involved to help him with diet choices. Will continue outpatient medications.  7. History of hyperglycemia. Hemoglobin A1c was checked and it was 5.6 on 12/08/2011.   8. Obesity. I strongly suspect and I would recommend to have nocturnal oximetry study done as well as possibly sleep study as outpatient if that is positive for nocturnal hypoxia. Patient will get also TSH checked.   TIME SPENT: 50 minutes.    ____________________________ Katharina Caper, MD rv:cms D: 12/15/2011 19:15:13 ET T: 12/16/2011 07:02:06 ET  JOB#: [id number] cc: Katharina Caper, MD, <Dictator> Dr. Alphonzo Severance [facility]  Dr. Suezanne Jacquet from [facility]  Guenevere Roorda MD ELECTRONICALLY SIGNED 01/14/2012 12:23

## 2020-12-30 DEATH — deceased
